# Patient Record
Sex: Female | Born: 2000 | Race: White | Hispanic: No | Marital: Single | Smoking: Never smoker
Health system: Southern US, Community
[De-identification: ages and names within clinical notes are randomized; demographics above are authoritative.]

---
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
## 2021-01-03 DIAGNOSIS — Z79899 Other long term (current) drug therapy: Secondary | ICD-10-CM | POA: Insufficient documentation

## 2021-01-03 DIAGNOSIS — R531 Weakness: Secondary | ICD-10-CM | POA: Diagnosis not present

## 2021-01-03 DIAGNOSIS — R202 Paresthesia of skin: Secondary | ICD-10-CM | POA: Insufficient documentation

## 2021-01-03 DIAGNOSIS — R200 Anesthesia of skin: Secondary | ICD-10-CM

## 2021-01-03 DIAGNOSIS — L550 Sunburn of first degree: Secondary | ICD-10-CM | POA: Diagnosis not present

## 2021-01-03 DIAGNOSIS — R000 Tachycardia, unspecified: Secondary | ICD-10-CM | POA: Diagnosis not present

## 2021-01-03 IMAGING — CT CT HEAD W/O CM
3 series · 16 of 47 positions shown, 19 images · non-contrast
Comparison: None.

CLINICAL DATA: 20-year-old female with tachycardia, weakness,
facial trauma.

EXAM:
CT HEAD WITHOUT CONTRAST
TECHNIQUE: Contiguous axial images were obtained from the base of the skull
through the vertex without intravenous contrast.

[Series 2: head wo · axial · 0.47mm/px · z∈[-119,+6]mm · 10 of 31 slices shown, 13 images]
[im 3/31  brain]
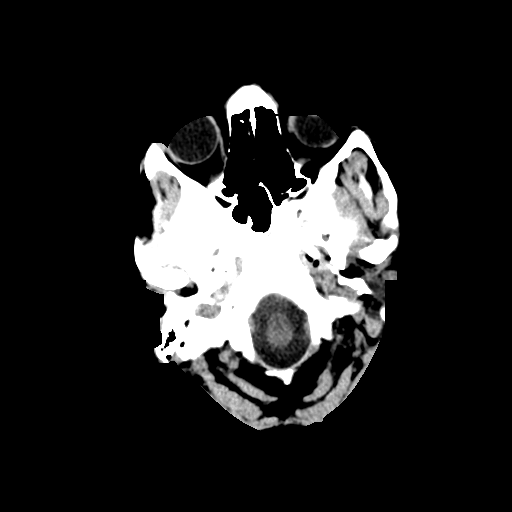
[im 3/31  bone]
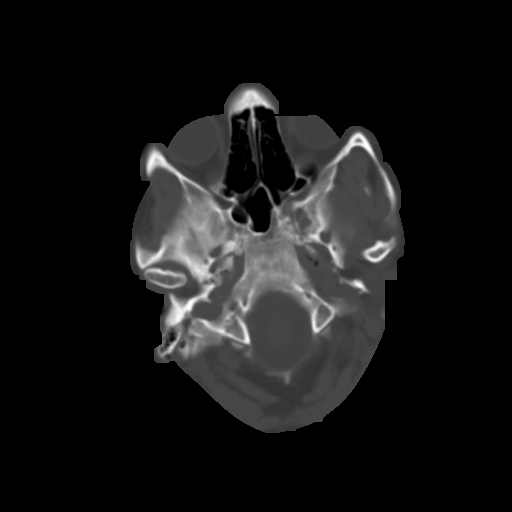
[im 6/31  brain]
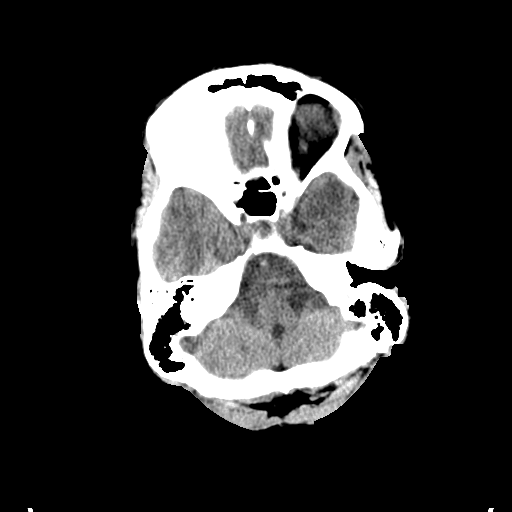
[im 9/31  brain]
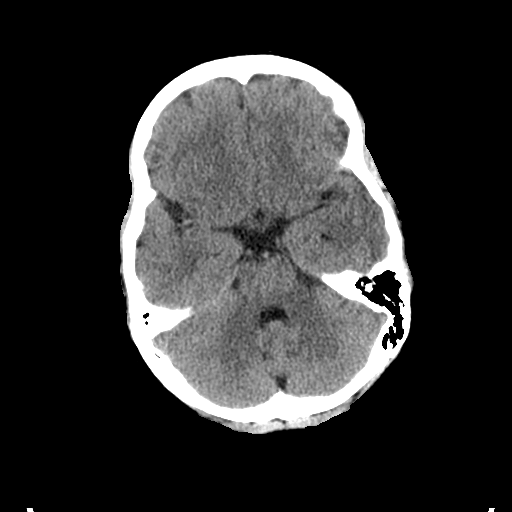
[im 11/31  brain]
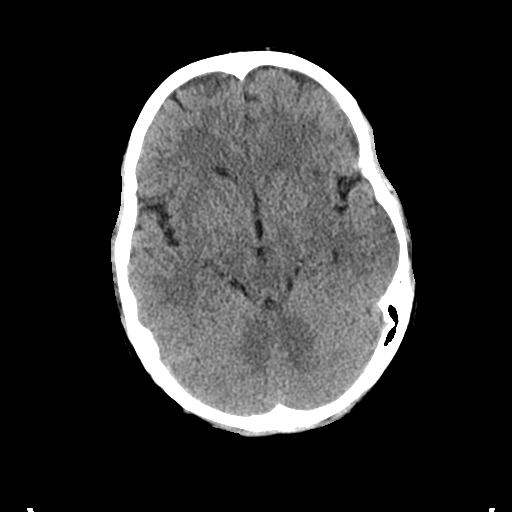
[im 14/31  brain]
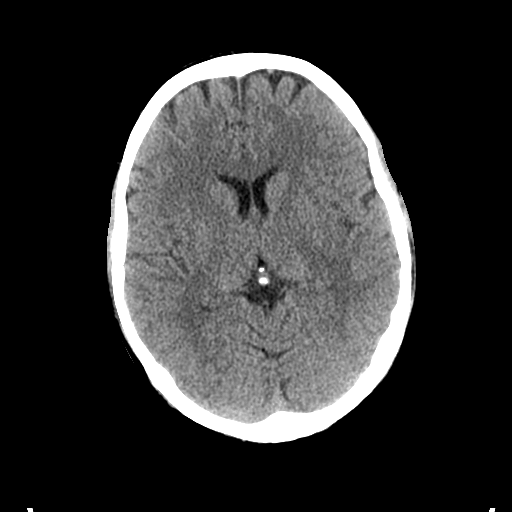
[im 14/31  bone]
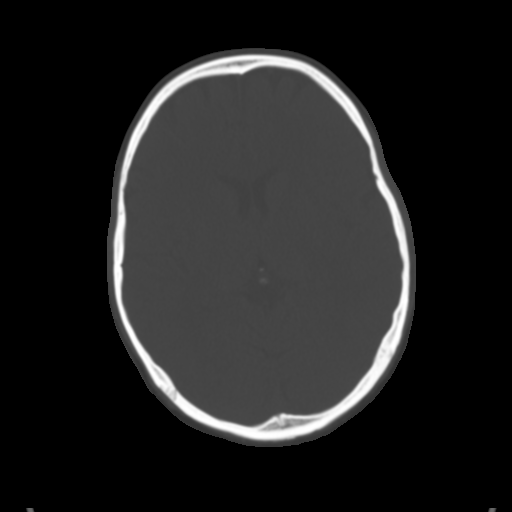
[im 17/31  brain]
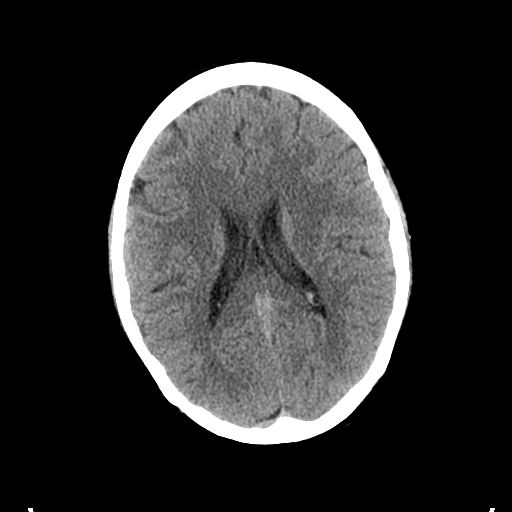
[im 20/31  brain]
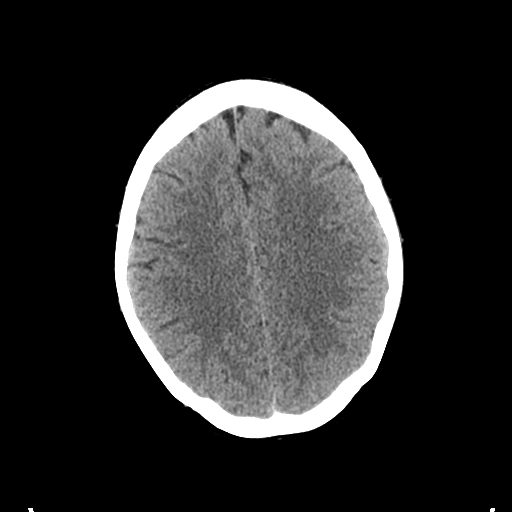
[im 23/31  brain]
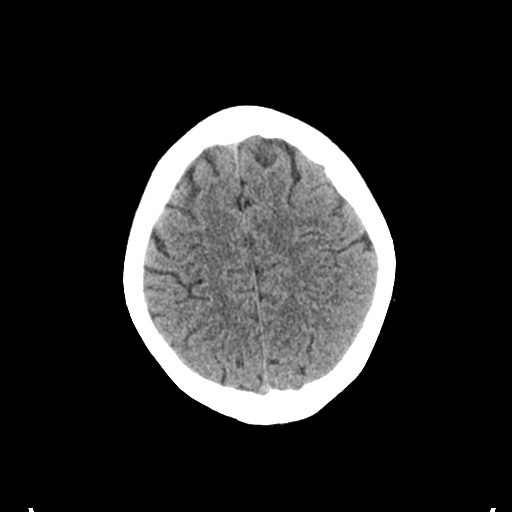
[im 25/31  brain]
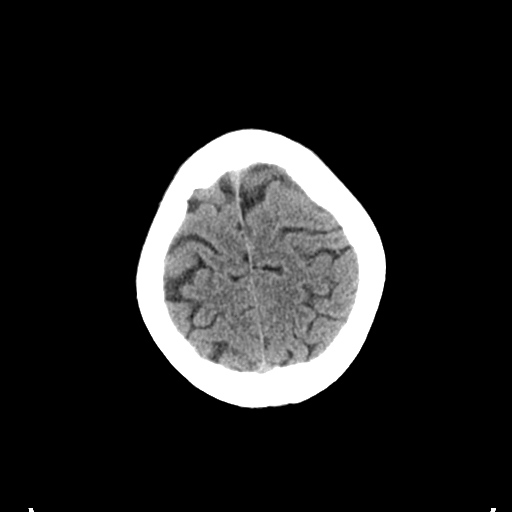
[im 25/31  bone]
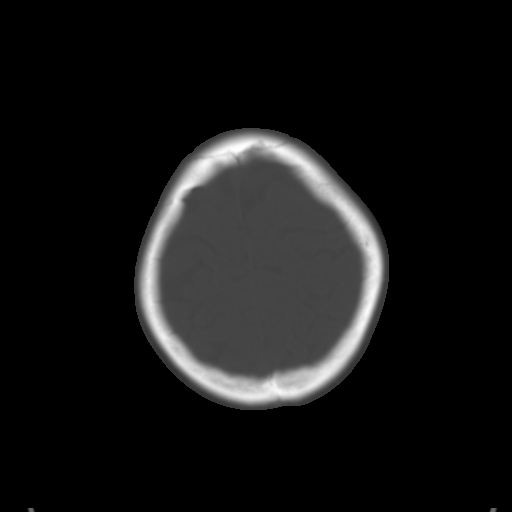
[im 28/31  brain]
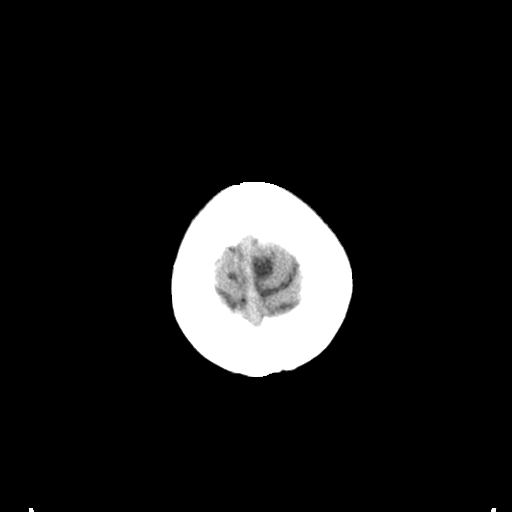

[Series 4: coronal soft tissue · coronal · 0.29mm/px · 3 of 64 slices shown]
[im 22/64  brain]
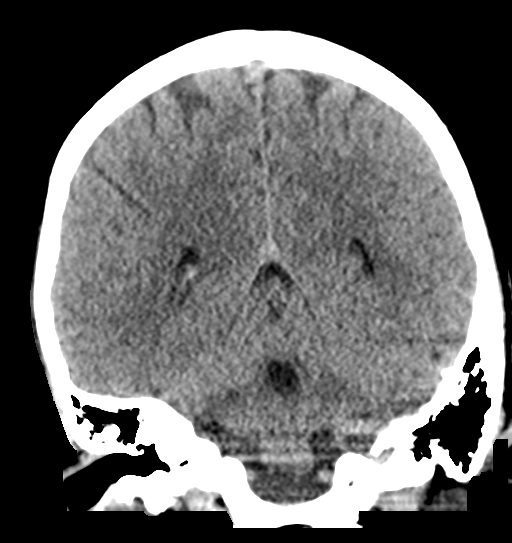
[im 29/64  brain]
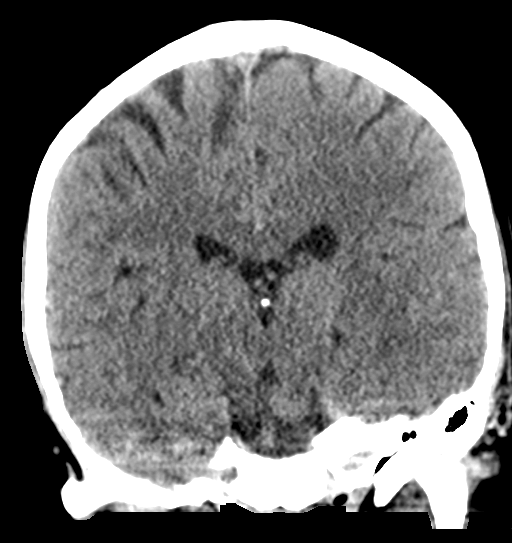
[im 36/64  brain]
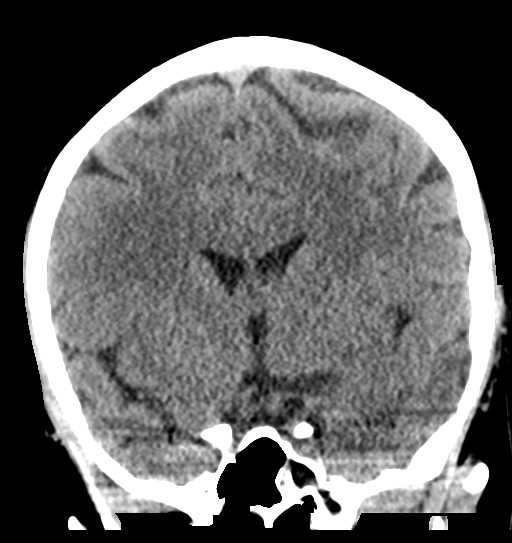

[Series 5: sagittal soft tissue · sagittal · 0.31mm/px · 3 of 50 slices shown]
[im 17/50  brain]
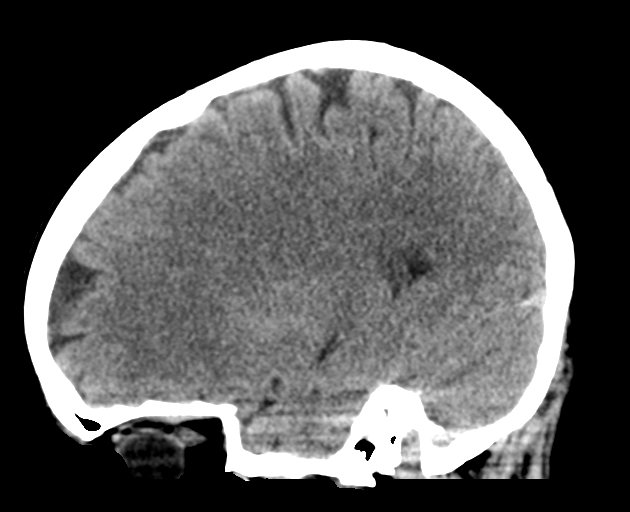
[im 25/50  brain]
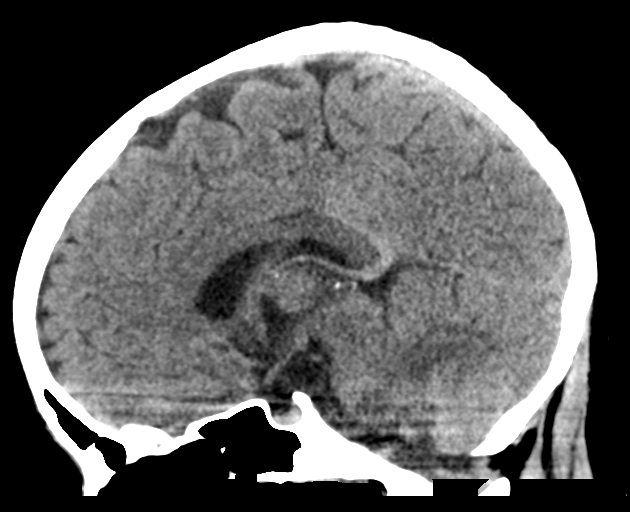
[im 33/50  brain]
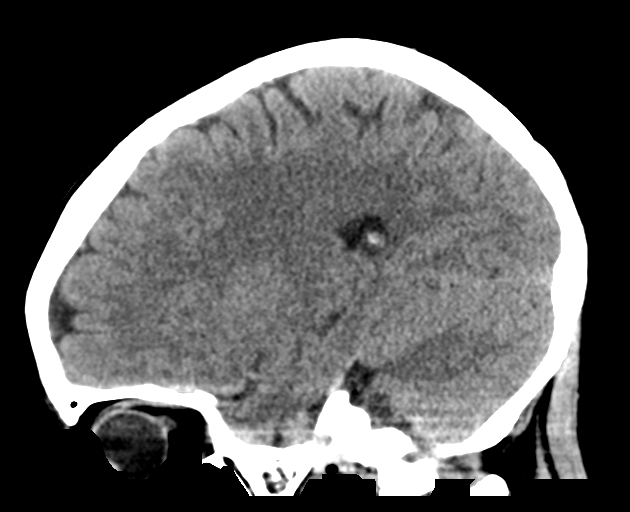

[16 of 47 positions shown; findings below may reference images not displayed]

FINDINGS: Brain: Normal cerebral volume. No midline shift, ventriculomegaly,
mass effect, evidence of mass lesion, intracranial hemorrhage or
evidence of cortically based acute infarction. Gray-white matter
differentiation is within normal limits throughout the brain.

Vascular: No suspicious intracranial vascular hyperdensity.

Skull: Negative.

Sinuses/Orbits: Visualized paranasal sinuses and mastoids are clear.

Other: Visualized orbits and scalp soft tissues are within normal
limits.
IMPRESSION: Normal noncontrast Head CT. No acute traumatic injury identified.

## 2021-01-03 IMAGING — MR MR HEAD WO/W CM
15 series · 48 of 48 positions shown · IV contrast (gadavist)
Comparison: Noncontrast head CT [DATE].

CLINICAL DATA: Numbness or tingling, paresthesia.

EXAM:
MRI HEAD WITHOUT AND WITH CONTRAST
TECHNIQUE: Multiplanar, multiecho pulse sequences of the brain and surrounding
structures were obtained without and with intravenous contrast.
CONTRAST:  5mL GADAVIST GADOBUTROL 1 MMOL/ML IV SOLN

[Series 5: DWI · axial · 3.0mm · 1.36mm/px · z∈[-45,+114]mm · 5 of 108 slices shown (1 of 2)]
[im 1/108]
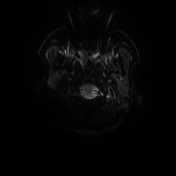
[im 27/108]
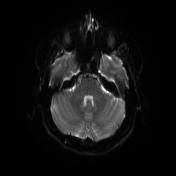
[im 54/108]
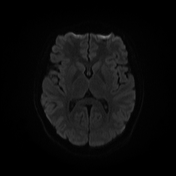
[im 81/108]
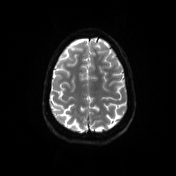
[im 108/108]
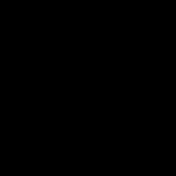

[Series 6: DWI · axial · 3.0mm · 1.36mm/px · z∈[-45,+111]mm · 3 of 53 slices shown (2 of 2)]
[im 1/53]
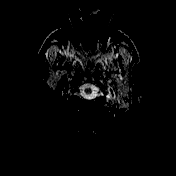
[im 27/53]
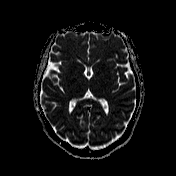
[im 53/53]
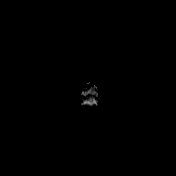

[Series 7: T1 · sagittal · 5.0mm · 0.75mm/px · 1 of 24 slices shown (1 of 4)]
[im 1/24]
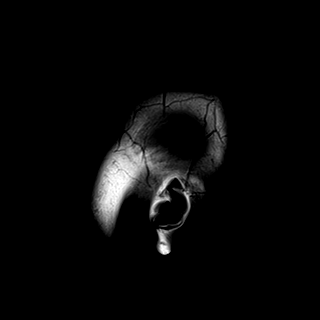

[Series 8: T2 · axial · 5.0mm · 0.62mm/px · 1 of 26 slices shown]
[im 1/26]
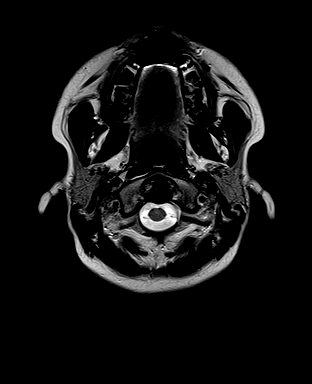

[Series 9: swi_images · axial · 3.0mm · 0.75mm/px · z∈[-51,+114]mm · 3 of 56 slices shown]
[im 1/56]
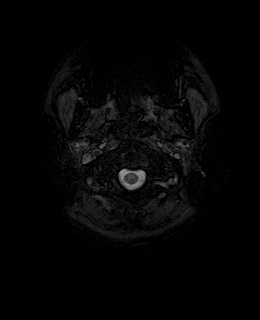
[im 28/56]
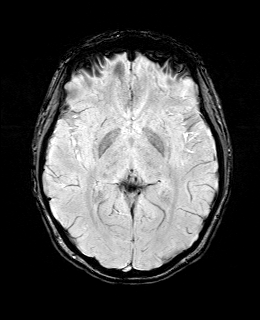
[im 56/56]
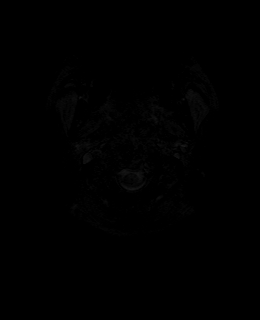

[Series 11: FLAIR · axial · 3.0mm · 0.75mm/px · z∈[-51,+114]mm · 3 of 56 slices shown]
[im 1/56]
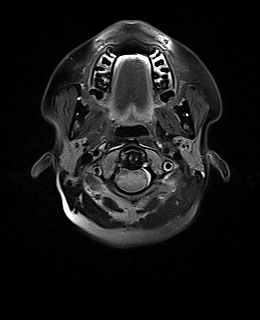
[im 28/56]
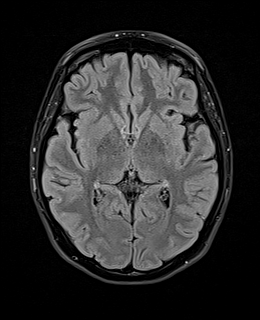
[im 56/56]
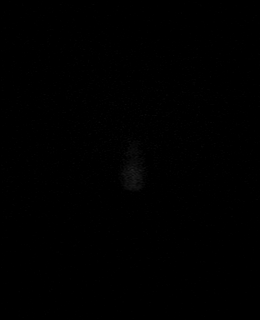

[Series 12: T1 · axial · 1.0mm · 0.94mm/px · z∈[-58,+117]mm · 9 of 176 slices shown (2 of 4)]
[im 1/176]
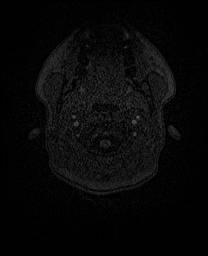
[im 22/176]
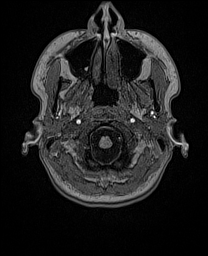
[im 44/176]
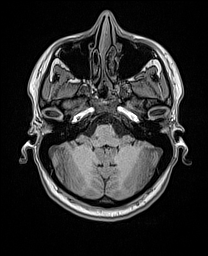
[im 66/176]
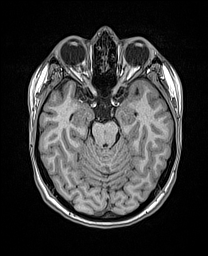
[im 88/176]
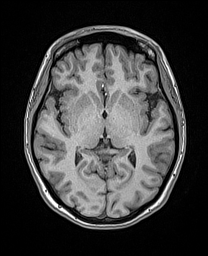
[im 110/176]
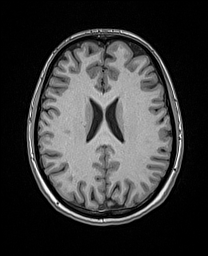
[im 132/176]
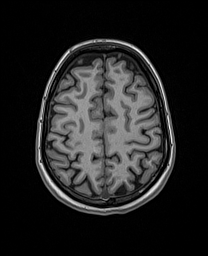
[im 154/176]
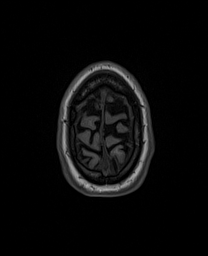
[im 176/176]
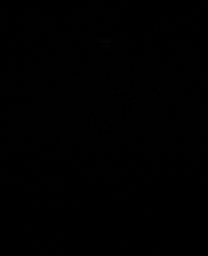

[Series 13: cor dwi_tracew · coronal · 5.0mm · 1.53mm/px · 3 of 58 slices shown]
[im 1/58]
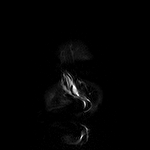
[im 29/58]
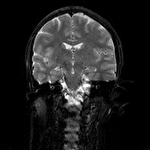
[im 58/58]
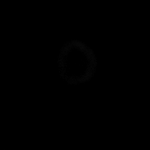

[Series 14: cor dwi_adc · coronal · 5.0mm · 1.53mm/px · 2 of 29 slices shown]
[im 1/29]
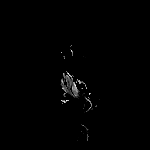
[im 29/29]
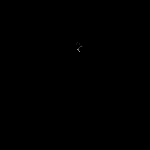

[Series 15: T2 post-contrast · coronal · 5.0mm · 0.57mm/px · 2 of 32 slices shown]
[im 1/32]
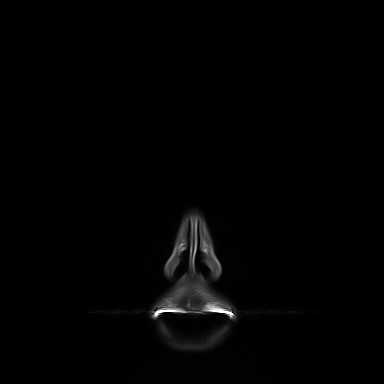
[im 32/32]
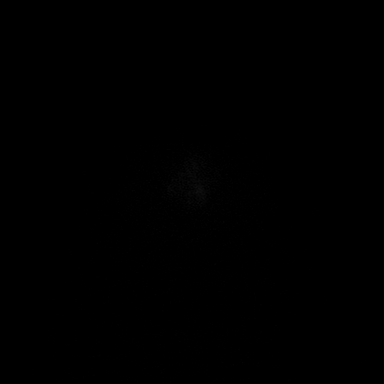

[Series 16: T1 post-contrast · axial · 1.0mm · 0.94mm/px · z∈[-58,+117]mm · 9 of 176 slices shown (1 of 3)]
[im 1/176]
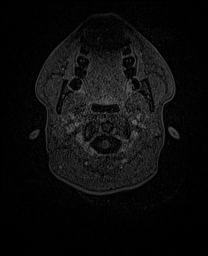
[im 22/176]
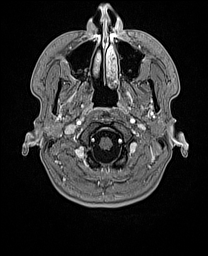
[im 44/176]
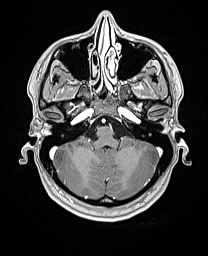
[im 66/176]
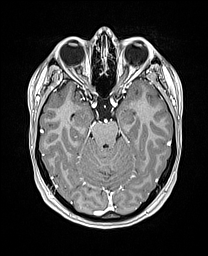
[im 88/176]
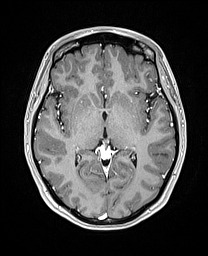
[im 110/176]
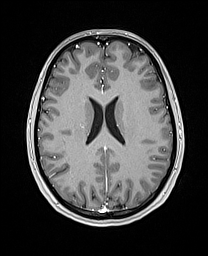
[im 132/176]
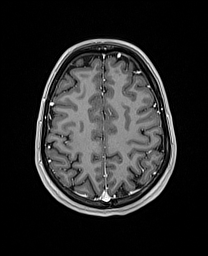
[im 154/176]
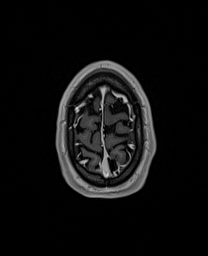
[im 176/176]
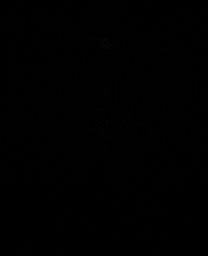

[Series 17: T1 · sagittal · 4.0mm · 0.94mm/px · 2 of 35 slices shown (3 of 4)]
[im 1/35]
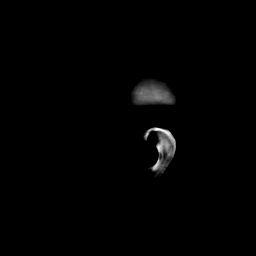
[im 35/35]
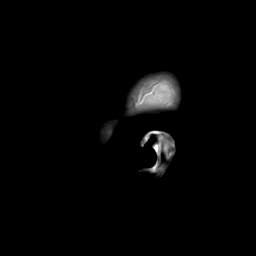

[Series 18: T1 · coronal · 4.0mm · 0.94mm/px · 2 of 43 slices shown (4 of 4)]
[im 1/43]
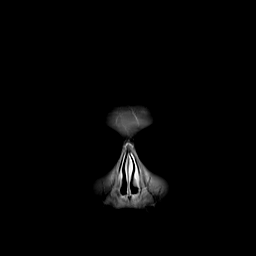
[im 43/43]
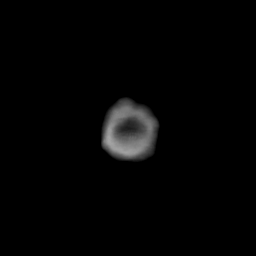

[Series 19: T1 post-contrast · coronal · 5.0mm · 0.43mm/px · 2 of 32 slices shown (2 of 3)]
[im 1/32]
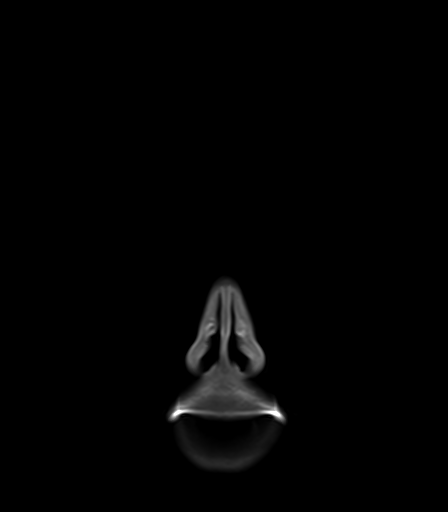
[im 32/32]
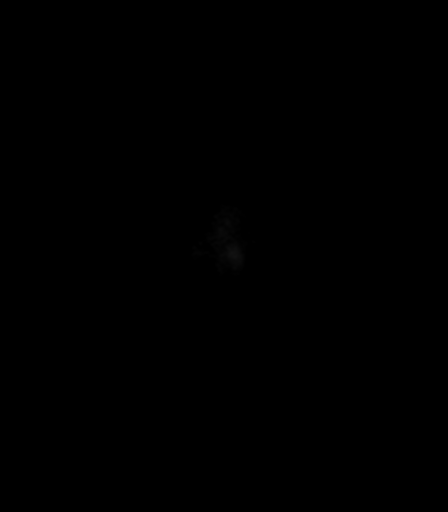

[Series 20: T1 post-contrast · sagittal · 5.0mm · 0.75mm/px · 1 of 24 slices shown (3 of 3)]
[im 1/24]
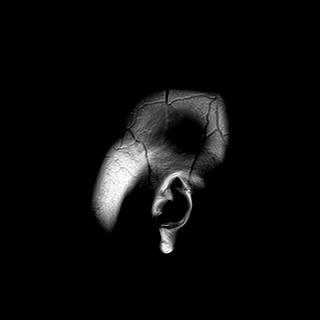

[48 of 48 positions shown; findings below may reference images not displayed]

FINDINGS: Brain:

Mild intermittent motion degradation.

Cerebral volume is normal.

3 mm hypoenhancing T2 hyperintense lesion within the anterior aspect
of the pituitary gland at midline (series 16, image 59) (series 15,
image 20).

No cortical encephalomalacia is identified. No significant white
matter disease.

There is no acute infarct.

No chronic intracranial blood products.

No extra-axial fluid collection.

No midline shift.

Vascular: Expected proximal arterial flow voids.

Skull and upper cervical spine: No focal marrow lesion.

Sinuses/Orbits: Visualized orbits show no acute finding. No
significant paranasal sinus disease. Incidentally noted arrested
sphenoid sinus pneumatization on the left.
IMPRESSION: 3 mm hypoenhancing focus within the anterior aspect of the pituitary
gland. Primary considerations include pituitary microadenoma versus
developmental cyst. Correlate with relevant endocrinologic
laboratory values.

Otherwise unremarkable MRI appearance of the brain.

No findings to suggest demyelinating disease.

## 2021-01-03 IMAGING — MR MR CERVICAL SPINE WO/W CM
7 of 8 series · 34 of 48 positions shown · IV contrast (gadavist)
Comparison: No pertinent prior exams available for comparison.

CLINICAL DATA: Numbness or tingling, paresthesia.

EXAM:
MRI CERVICAL SPINE WITHOUT AND WITH CONTRAST
TECHNIQUE: Multiplanar and multiecho pulse sequences of the cervical spine, to
include the craniocervical junction and cervicothoracic junction,
were obtained without and with intravenous contrast.
CONTRAST:  5mL GADAVIST GADOBUTROL 1 MMOL/ML IV SOLN

[Series 5: T1 · sagittal · 3.0mm · 0.69mm/px · 4 of 15 slices shown (1 of 2)]
[im 1/15]
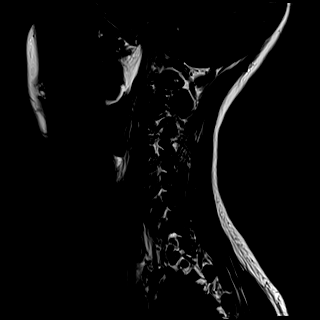
[im 5/15]
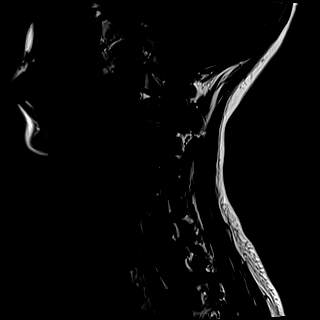
[im 10/15]
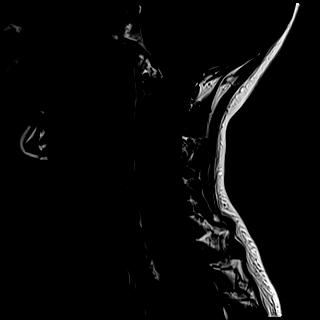
[im 15/15]
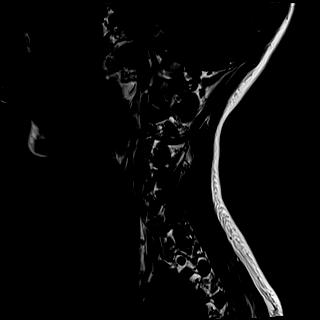

[Series 6: STIR · sagittal · 3.0mm · 0.86mm/px · 4 of 15 slices shown]
[im 1/15]
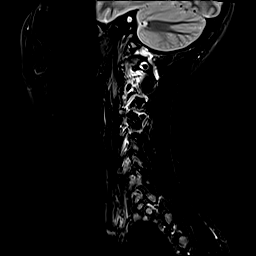
[im 5/15]
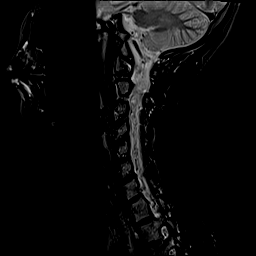
[im 10/15]
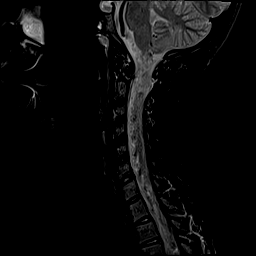
[im 15/15]
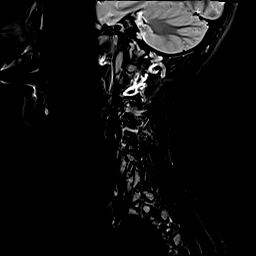

[Series 7: T2 · axial · 3.0mm · 0.78mm/px · z∈[-186,-78]mm · 8 of 32 slices shown]
[im 1/32]
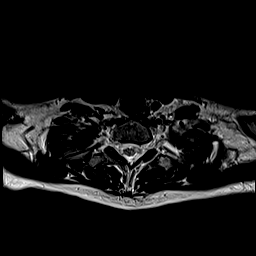
[im 5/32]
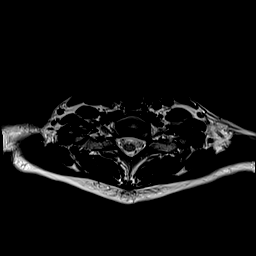
[im 9/32]
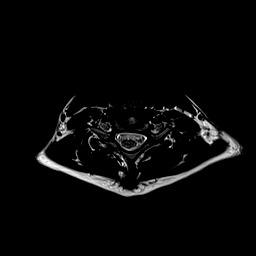
[im 14/32]
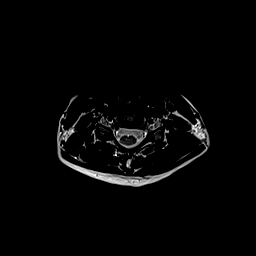
[im 18/32]
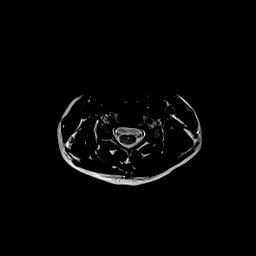
[im 23/32]
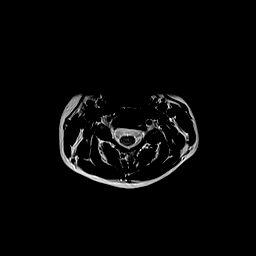
[im 27/32]
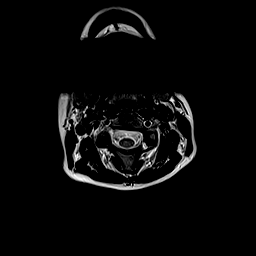
[im 32/32]
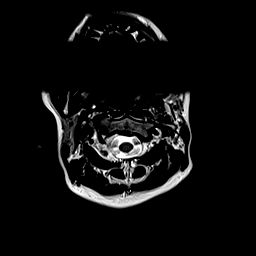

[Series 9: T1 · axial · 3.0mm · 0.39mm/px · z∈[-186,-78]mm · 8 of 32 slices shown (2 of 2)]
[im 1/32]
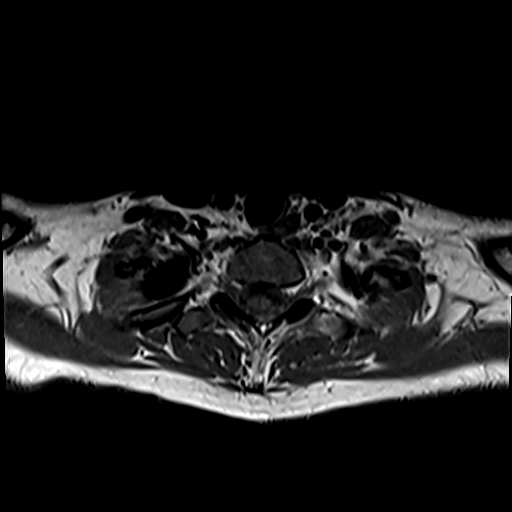
[im 5/32]
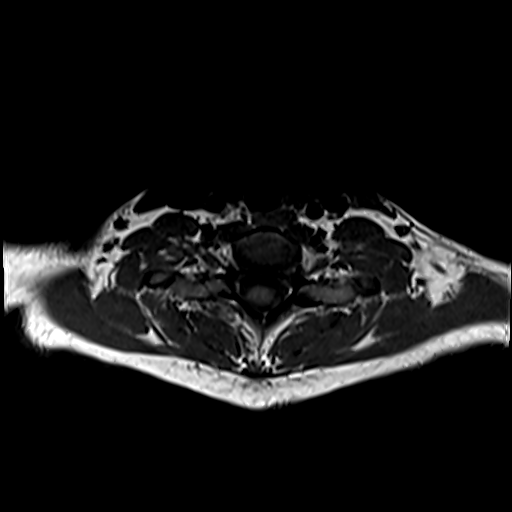
[im 9/32]
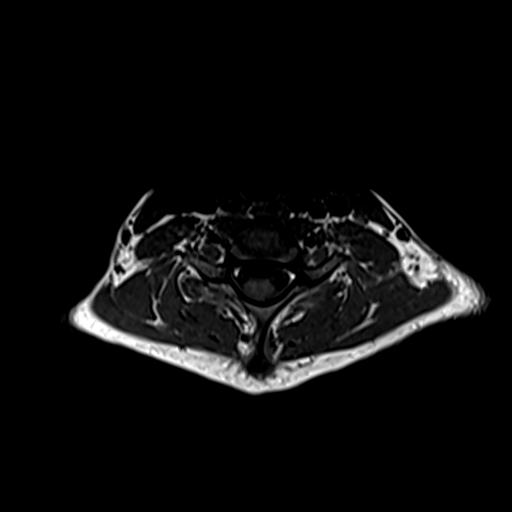
[im 14/32]
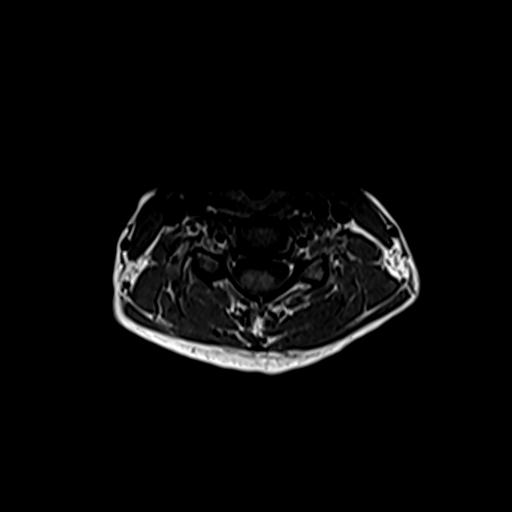
[im 18/32]
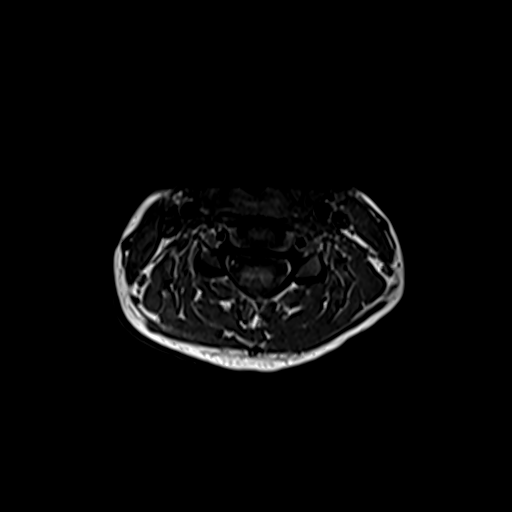
[im 23/32]
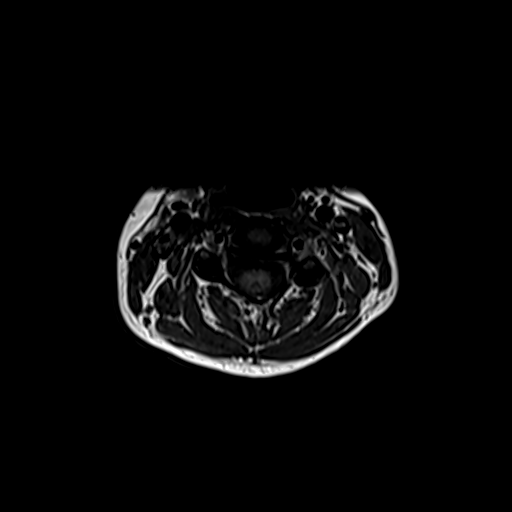
[im 27/32]
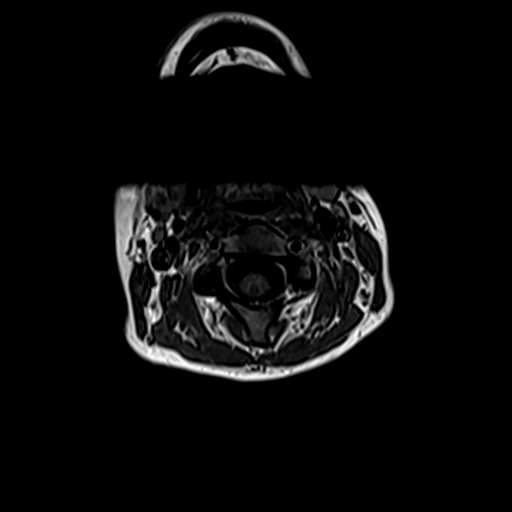
[im 32/32]
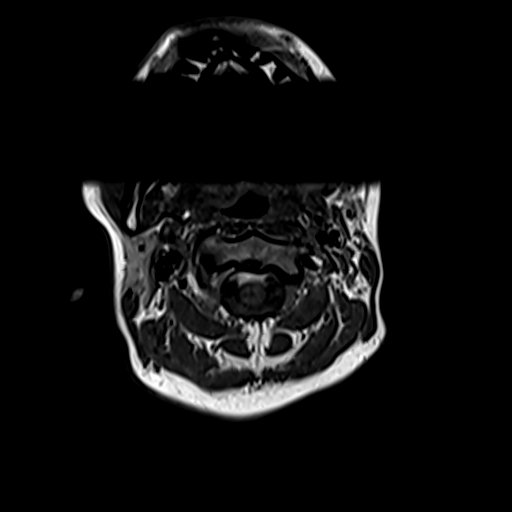

[Series 10: T2 post-contrast · sagittal · 3.0mm · 0.69mm/px · 4 of 15 slices shown]
[im 1/15]
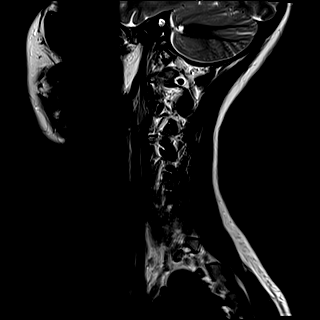
[im 5/15]
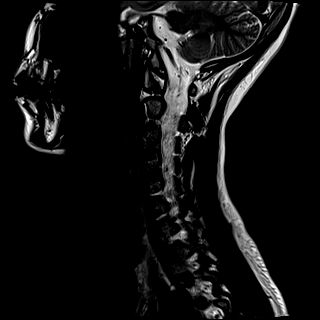
[im 10/15]
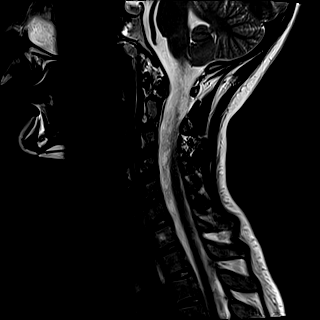
[im 15/15]
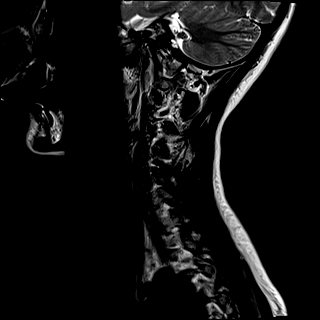

[Series 11: T1 fat-sat post-contrast · sagittal · 3.0mm · 0.69mm/px · 4 of 15 slices shown]
[im 1/15]
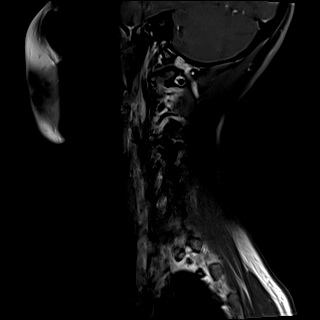
[im 5/15]
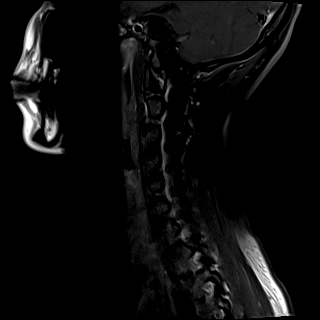
[im 10/15]
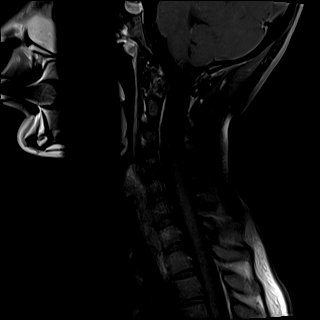
[im 15/15]
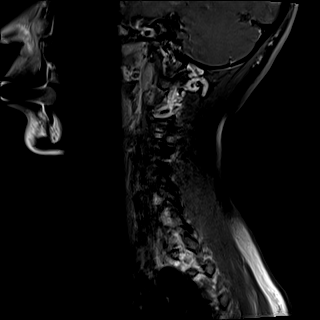

[Series 12: T1 post-contrast · axial · 3.0mm · 0.39mm/px · z∈[-186,-172]mm · 2 of 32 slices shown]
[im 1/32]
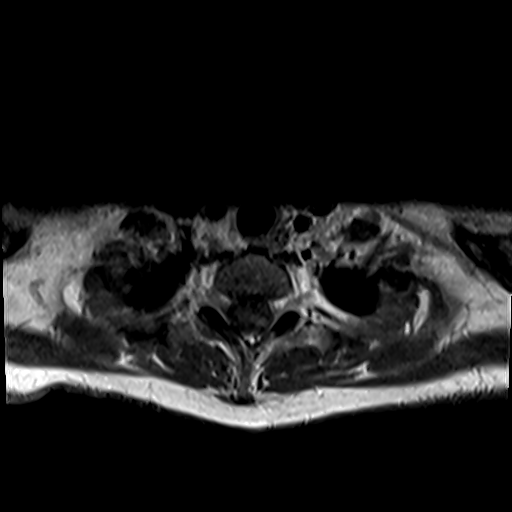
[im 5/32]
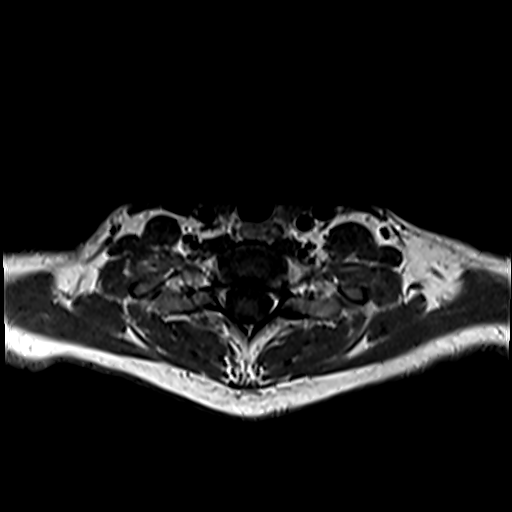

[34 of 48 positions shown; findings below may reference images not displayed]

FINDINGS: Mild intermittent motion degradation.

Alignment: No significant spondylolisthesis.

Vertebrae: Vertebral body height is maintained. Small T1
hypointense, T2/STIR hyperintense and enhancing lesions within the
C7 and T2 vertebral bodies (2 total) measuring up to 5 mm,
indeterminate. Elsewhere, no significant marrow edema or focal
suspicious osseous lesion is identified.

Cord: No spinal cord signal abnormality is identified. No abnormal
cord enhancement.

Posterior Fossa, vertebral arteries, paraspinal tissues: Posterior
fossa better assessed on same-day brain MRI. Flow voids preserved
within the imaged cervical vertebral arteries. Paraspinal soft
tissues within normal limits.

Disc levels:

Minimal disc desiccation at C3-C4 and C4-C5. Intervertebral disc
height is maintained. No significant disc herniation, spinal canal
stenosis or neural foraminal narrowing.
IMPRESSION: No cervical spinal cord signal abnormality or abnormal enhancement.

Minimal disc desiccation at C3-C4 and C4-C5. No significant disc
herniation, spinal canal stenosis or neural foraminal narrowing.

Small enhancing lesions within the C7 and T2 vertebral bodies (2
total), as described and measuring up to 5 mm. These may reflect
atypical hemangiomas. However, alternative etiologies cannot be
excluded. 4-6 month MRI follow-up is recommended to ensure
stability.

## 2021-01-03 NOTE — ED Notes (Signed)
Spoke with poison control. Provided update on EKG, V/S, and pt status. Requested to call if anything changes, otherwise case closed on their end.

## 2021-01-03 NOTE — ED Notes (Signed)
Poison control called regarding pt taking a Valerian root medication that was prescribed to her, medication is in [place] and an interpretor was called to determine what the medication was. Poison control recommended an EKG and to call them back if it is abnormal. If EKG is normal, poison control will clear the pt case due to the medication window being 2 hours and pt taking this 4-5 hours ago.

## 2021-01-03 NOTE — Discharge Instructions (Addendum)
There are small enhancing lesions within the C7 and T2 vertebral bodies (2 total). These may reflect atypical hemangiomas. However, 4-6 month MRI follow-up is recommended to ensure stability.  Your Brain MRI shows 3 mm hypoenhancing focus within the anterior aspect of the pituitary gland. Primary considerations include pituitary microadenoma versus developmental cyst. You will need to follow up with your primary care doctor for further evaluation with labs pertaining to endocrine testing

## 2021-01-03 NOTE — ED Provider Notes (Addendum)
[facility]-EMERGENCY DEPT Provider Note   CSN: [id number] Arrival date & time: 01/03/21  0351     History Chief Complaint  Patient presents with   Sunburn   Tachycardia    Diane Ochoa is a 20 y.o. female.  The history is provided by the patient and a parent.  Weakness Severity:  Severe Onset quality:  Sudden Duration:  4 hours Timing:  Constant Progression:  Unchanged Chronicity:  New Context: not change in medication, not decreased sleep, not drug use and not recent infection   Relieved by:  Nothing Worsened by:  Nothing Ineffective treatments:  None tried Associated symptoms: difficulty walking   Associated symptoms: no chest pain, no drooling, no dysuria, no fever, no shortness of breath and no vomiting   Associated symptoms comment:  Tachycardia but [place] lot of caffeine [place]  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  Patient [place] and sodas and then reports she was having increased heart rate which has happened in the past when she drank caffeine.  She then took a [place] supplement that is reportedly valerian root for the tachycardia.  She reports tingling in the legs and difficulty walking.  Symptoms are symmetric.  No trauma.  No HA.  No f/c/r.  No rashes on the skin.  No tick exposure.      History reviewed. No pertinent past medical history.  There are no problems to display for this patient.   History reviewed. No pertinent surgical history.   OB History   No obstetric history on file.     History reviewed. No pertinent family history.  Social History   Tobacco Use   Smoking status: Never   Smokeless tobacco: Never    Home Medications Prior to Admission medications    Medication Sig Start Date End Date Taking? Authorizing Provider  PRESCRIPTION MEDICATION Take 5 mLs by mouth 3 (three) times daily as needed (increased heart rate). [place] medication) Korbalol Solution   Yes [provider]    Allergies    Patient has no known allergies.  Review of Systems   Review of Systems  Constitutional:  Negative for fever.  HENT:  Negative for drooling.   Eyes:  Negative for redness.  Respiratory:  Negative for shortness of breath.   Cardiovascular:  Negative for chest pain.  Gastrointestinal:  Negative for vomiting.  Genitourinary:  Negative for dysuria.  Musculoskeletal:  Negative for neck stiffness.  Skin:  Negative for rash.       Sunburn right shoulder   Neurological:  Positive for weakness and numbness.  Psychiatric/Behavioral:  Negative for agitation.   All other systems reviewed and are negative.  Physical Exam Updated Vital Signs BP 126/82   Pulse 94   Temp 98.4 F (36.9 C) (Oral)   Resp 17   Wt 53 kg   LMP 12/27/2020 (Approximate)   SpO2 100%   Physical Exam Vitals and nursing note reviewed.  Constitutional:      General: She is not in acute distress.    Appearance: Normal appearance.  HENT:     Head: Normocephalic and atraumatic.     Nose: Nose normal.  Eyes:     Conjunctiva/sclera: Conjunctivae normal.     Pupils:  Pupils are equal, round, and reactive to light.  Cardiovascular:     Rate and Rhythm: Regular rhythm. Tachycardia present.     Pulses: Normal pulses.     Heart sounds: Normal heart sounds.  Pulmonary:     Effort: Pulmonary effort is normal.     Breath sounds: Normal breath sounds.  Abdominal:     General: Abdomen is flat. Bowel sounds are normal.     Palpations: Abdomen is soft.  Musculoskeletal:        General: No tenderness.     Cervical back: Normal range of motion and neck supple.     Right lower leg: No edema.     Left lower leg: No edema.  Skin:    General: Skin is warm and dry.      Capillary Refill: Capillary refill takes less than 2 seconds.       Neurological:     Mental Status: She is alert.     Sensory: No sensory deficit.     Deep Tendon Reflexes: Reflexes normal.     Comments: 5-/5 in all four.  Can't stand without, appear clumsy  Sensation intact to confrontation. 3+ DTR upper and lower   Psychiatric:        Mood and Affect: Mood normal.        Behavior: Behavior normal.    ED Results / Procedures / Treatments   Labs (all labs ordered are listed, but only abnormal results are displayed) Results for orders placed or performed during the hospital encounter of 01/03/21  CBC with Differential/Platelet  Result Value Ref Range   WBC 8.1 4.0 - 10.5 K/uL   RBC 3.95 3.87 - 5.11 MIL/uL   Hemoglobin 12.7 12.0 - 15.0 g/dL   HCT 19.5 09.3 - 26.7 %   MCV 93.7 80.0 - 100.0 fL   MCH 32.2 26.0 - 34.0 pg   MCHC 34.3 30.0 - 36.0 g/dL   RDW 12.4 58.0 - 99.8 %   Platelets 354 150 - 400 K/uL   nRBC 0.0 0.0 - 0.2 %   Neutrophils Relative % 76 %   Neutro Abs 6.1 1.7 - 7.7 K/uL   Lymphocytes Relative 19 %   Lymphs Abs 1.5 0.7 - 4.0 K/uL   Monocytes Relative 5 %   Monocytes Absolute 0.4 0.1 - 1.0 K/uL   Eosinophils Relative 0 %   Eosinophils Absolute 0.0 0.0 - 0.5 K/uL   Basophils Relative 0 %   Basophils Absolute 0.0 0.0 - 0.1 K/uL   Immature Granulocytes 0 %   Abs Immature Granulocytes 0.03 0.00 - 0.07 K/uL  Urinalysis, Routine w reflex microscopic Urine, Clean Catch  Result Value Ref Range   Color, Urine COLORLESS (A) YELLOW   APPearance CLEAR CLEAR   Specific Gravity, Urine 1.004 (L) 1.005 - 1.030   pH 8.0 5.0 - 8.0   Glucose, UA NEGATIVE NEGATIVE mg/dL   Hgb urine dipstick NEGATIVE NEGATIVE   Bilirubin Urine NEGATIVE NEGATIVE   Ketones, ur NEGATIVE NEGATIVE mg/dL   Protein, ur NEGATIVE NEGATIVE mg/dL   Nitrite NEGATIVE NEGATIVE   Leukocytes,Ua NEGATIVE NEGATIVE   RBC / HPF 0-5 0 - 5 RBC/hpf   WBC, UA 0-5 0 - 5 WBC/hpf   Bacteria, UA RARE (A) NONE SEEN    Mucus PRESENT   Rapid urine drug screen (hospital performed)  Result Value Ref Range   Opiates NONE DETECTED NONE DETECTED   Cocaine NONE DETECTED NONE DETECTED   Benzodiazepines NONE DETECTED NONE DETECTED  Amphetamines NONE DETECTED NONE DETECTED   Tetrahydrocannabinol NONE DETECTED NONE DETECTED   Barbiturates NONE DETECTED NONE DETECTED  Acetaminophen level  Result Value Ref Range   Acetaminophen (Tylenol), Serum <10 (L) 10 - 30 ug/mL  Salicylate level  Result Value Ref Range   Salicylate Lvl <7.0 (L) 7.0 - 30.0 mg/dL  Ethanol  Result Value Ref Range   Alcohol, Ethyl (B) <10 <10 mg/dL  CK  Result Value Ref Range   Total CK 113 38 - 234 U/L  I-stat chem 8, ED (not at [facility] or [facility])  Result Value Ref Range   Sodium 144 135 - 145 mmol/L   Potassium 4.3 3.5 - 5.1 mmol/L   Chloride 106 98 - 111 mmol/L   BUN 4 (L) 6 - 20 mg/dL   Creatinine, Ser 5.63 0.44 - 1.00 mg/dL   Glucose, Bld 149 (H) 70 - 99 mg/dL   Calcium, Ion 7.02 6.37 - 1.40 mmol/L   TCO2 26 22 - 32 mmol/L   Hemoglobin 11.9 (L) 12.0 - 15.0 g/dL   HCT 85.8 (L) 85.0 - 27.7 %  I-Stat Beta hCG blood, ED ([facility], [facility], [facility] only)  Result Value Ref Range   I-stat hCG, quantitative <5.0 <5 mIU/mL   Comment 3          CBG monitoring, ED  Result Value Ref Range   Glucose-Capillary 92 70 - 99 mg/dL   CT Head Wo Contrast  Result Date: 01/03/2021 CLINICAL DATA:  20 year old female with tachycardia, weakness, facial trauma. EXAM: CT HEAD WITHOUT CONTRAST TECHNIQUE: Contiguous axial images were obtained from the base of the skull through the vertex without intravenous contrast. COMPARISON:  None. FINDINGS: Brain: Normal cerebral volume. No midline shift, ventriculomegaly, mass effect, evidence of mass lesion, intracranial hemorrhage or evidence of cortically based acute infarction. Gray-white matter differentiation is within normal limits throughout the brain. Vascular: No suspicious intracranial vascular hyperdensity. Skull: Negative.  Sinuses/Orbits: Visualized paranasal sinuses and mastoids are clear. Other: Visualized orbits and scalp soft tissues are within normal limits. IMPRESSION: Normal noncontrast Head CT. No acute traumatic injury identified. Electronically Signed   By: Odessa Fleming M.D.   On: 01/03/2021 06:06     Procedures Procedures   Medications Ordered in ED Medications  lidocaine (XYLOCAINE) 2 % jelly 1 application (has no administration in time range)  sodium chloride 0.9 % bolus 500 mL (has no administration in time range)    ED Course  I have reviewed the triage vital signs and the nursing notes.  Pertinent labs & imaging results that were available during my care of the patient were reviewed by me and considered in my medical decision making (see chart for details).   605 case d/w Dr. Iver Nestle, labs and MRI of brain and C spine.  Reassess and if still weak and falling consult neuro    Final Clinical Impression(s) / ED Diagnoses   Signed out to Dr. Criss Alvine pending MRI and reassessment         Romen Yutzy, MD 01/03/21 [id number]

## 2021-01-03 NOTE — ED Triage Notes (Signed)
Pt to ED from home with c/o sunburn, tingling sensation and weakness in her legs. Pt endorses drinking redbull and being in the pool yesterday. Arrives with elevated HR, Hx of the same due to caffeine.

## 2021-01-03 NOTE — ED Notes (Signed)
Patient to MRI.

## 2021-01-03 NOTE — ED Provider Notes (Signed)
7:19 AM Care transferred to me. Briefly, we are waiting on MRI brain/c-spine. If this is negative, and she can get up to walk, she can be discharged with outpatient follow up. Otherwise may need admission   9:43 AM Patient was considering leaving prior to MRI.  It seems like she will probably get her MRI around 11 AM.  She is able to ambulate and feels like her symptoms are improving.  I encouraged her to stay and she is willing to get this MRI and then we will reassess.  1:48 PM MRI results reviewed with neurology and there is no further work-up needed emergently.  Will refer to outpatient neuro.  I have made her and family aware of the abnormal findings that do not seem clinically significant today.  Will need outpatient neuro follow-up and PCP follow-up.  Discussed return precautions.  From a symptomatic perspective, the numbness has essentially almost resolved and she has been ambulatory without difficulty.   Pricilla Loveless, MD 01/03/21 1536

## 2021-01-03 NOTE — ED Notes (Signed)
Pt to CT via stretcher
